# Patient Record
Sex: Male | Born: 1965 | Race: White | Hispanic: No | Marital: Married | Smoking: Current every day smoker
Health system: Southern US, Community
[De-identification: ages and names within clinical notes are randomized; demographics above are authoritative.]

## PROBLEM LIST (undated history)

## (undated) DIAGNOSIS — M810 Age-related osteoporosis without current pathological fracture: Secondary | ICD-10-CM

## (undated) DIAGNOSIS — E119 Type 2 diabetes mellitus without complications: Secondary | ICD-10-CM

## (undated) DIAGNOSIS — N289 Disorder of kidney and ureter, unspecified: Secondary | ICD-10-CM

## (undated) HISTORY — DX: Age-related osteoporosis without current pathological fracture: M81.0

## (undated) HISTORY — PX: BACK SURGERY: SHX140

## (undated) HISTORY — DX: Type 2 diabetes mellitus without complications: E11.9

## (undated) HISTORY — DX: Disorder of kidney and ureter, unspecified: N28.9

---

## 2016-12-04 LAB — HEMOGLOBIN A1C: Hemoglobin A1C: 8.6

## 2016-12-04 LAB — LIPID PANEL
Cholesterol: 159 (ref 0–200)
HDL: 45 (ref 35–70)
LDL CALC: 86
Triglycerides: 257 — AB (ref 40–160)

## 2016-12-04 LAB — BASIC METABOLIC PANEL
BUN: 47 — AB (ref 4–21)
CREATININE: 1.7 — AB (ref <=1.3)

## 2016-12-25 ENCOUNTER — Other Ambulatory Visit (HOSPITAL_COMMUNITY): Payer: Self-pay | Admitting: Internal Medicine

## 2016-12-25 DIAGNOSIS — N183 Chronic kidney disease, stage 3 unspecified: Secondary | ICD-10-CM

## 2016-12-31 ENCOUNTER — Ambulatory Visit (HOSPITAL_COMMUNITY)
Admission: RE | Admit: 2016-12-31 | Discharge: 2016-12-31 | Disposition: A | Discharge status: Home / Self Care | Payer: Medicaid Other | Source: Ambulatory Visit | Attending: Internal Medicine | Admitting: Internal Medicine

## 2016-12-31 DIAGNOSIS — N183 Chronic kidney disease, stage 3 unspecified: Secondary | ICD-10-CM

## 2017-01-22 ENCOUNTER — Ambulatory Visit: Payer: Self-pay | Admitting: "Endocrinology

## 2017-02-12 ENCOUNTER — Encounter: Payer: Self-pay | Admitting: "Endocrinology

## 2017-02-12 ENCOUNTER — Ambulatory Visit (INDEPENDENT_AMBULATORY_CARE_PROVIDER_SITE_OTHER): Payer: Medicaid Other | Admitting: "Endocrinology

## 2017-02-12 VITALS — BP 147/94 | HR 76 | Ht 75.0 in | Wt 278.0 lb

## 2017-02-12 DIAGNOSIS — E782 Mixed hyperlipidemia: Secondary | ICD-10-CM | POA: Diagnosis not present

## 2017-02-12 DIAGNOSIS — I10 Essential (primary) hypertension: Secondary | ICD-10-CM

## 2017-02-12 DIAGNOSIS — Z6834 Body mass index (BMI) 34.0-34.9, adult: Secondary | ICD-10-CM | POA: Diagnosis not present

## 2017-02-12 DIAGNOSIS — Z794 Long term (current) use of insulin: Secondary | ICD-10-CM

## 2017-02-12 DIAGNOSIS — E6609 Other obesity due to excess calories: Secondary | ICD-10-CM

## 2017-02-12 DIAGNOSIS — E1122 Type 2 diabetes mellitus with diabetic chronic kidney disease: Secondary | ICD-10-CM | POA: Diagnosis not present

## 2017-02-12 DIAGNOSIS — N183 Chronic kidney disease, stage 3 unspecified: Secondary | ICD-10-CM

## 2017-02-12 NOTE — Patient Instructions (Signed)

## 2017-02-12 NOTE — Progress Notes (Signed)
Subjective:    Patient ID: Richard Crane, male    DOB: 1966/01/29.  he is being seen in consultation for management of currently uncontrolled symptomatic diabetes requested by  Richard Gully, Richard Crane.   Past Medical History:  Diagnosis Date  . Diabetes mellitus, type II (HCC)   . Kidney disease   . Osteoporosis    Past Surgical History:  Procedure Laterality Date  . BACK SURGERY     Social History   Social History  . Marital status: Married    Spouse name: N/A  . Number of children: N/A  . Years of education: N/A   Social History Main Topics  . Smoking status: Current Every Day Smoker  . Smokeless tobacco: Never Used  . Alcohol use None  . Drug use: No  . Sexual activity: Not Asked   Other Topics Concern  . None   Social History Narrative  . None   Outpatient Encounter Prescriptions as of 02/12/2017  Medication Sig  . insulin glargine (LANTUS) 100 UNIT/ML injection Inject 60 Units into the skin at bedtime.  Marland Kitchen zolpidem (AMBIEN) 10 MG tablet Take 10 mg by mouth at bedtime as needed.  . insulin lispro (HUMALOG) 100 UNIT/ML injection Inject 10-16 Units into the skin 3 (three) times daily before meals.  Marland Kitchen lisinopril (PRINIVIL,ZESTRIL) 5 MG tablet Take 5 mg by mouth daily.  . simvastatin (ZOCOR) 5 MG tablet Take 5 mg by mouth daily.  . [DISCONTINUED] Insulin Glargine (LANTUS SOLOSTAR [place]) Inject into the skin every morning.   No facility-administered encounter medications on file as of 02/12/2017.     ALLERGIES: No Known Allergies  VACCINATION STATUS:  There is no immunization history on file for this patient.  Diabetes  He presents for his follow-up diabetic visit. He has type 2 diabetes mellitus. Onset time: He was diagnosed at approximate age of 51 years. His disease course has been worsening. There are no hypoglycemic associated symptoms. Pertinent negatives for hypoglycemia include no confusion, headaches, pallor or seizures. Associated symptoms include blurred  vision, polydipsia and polyuria. Pertinent negatives for diabetes include no chest pain, no fatigue, no polyphagia and no weakness. There are no hypoglycemic complications. Symptoms are worsening. Diabetic complications include nephropathy. Current diabetic treatment includes intensive insulin program. His weight is stable. He is following a generally unhealthy diet. When asked about meal planning, he reported none. He has not had a previous visit with a dietitian. He never participates in exercise. (He did not bring any meter nor logs to review today. His most recent A1c was 8.6%.) An ACE inhibitor/angiotensin II receptor blocker is being taken. He does not see a podiatrist. Hyperlipidemia  This is a chronic problem. The current episode started more than 1 year ago. The problem is uncontrolled (His current triglyceride level 257 from 12/04/2016.). Exacerbating diseases include diabetes and obesity. Pertinent negatives include no chest pain, myalgias or shortness of breath. Risk factors for coronary artery disease include diabetes mellitus, dyslipidemia, hypertension, a sedentary lifestyle, family history, male sex and obesity.  Hypertension  This is a chronic problem. The current episode started more than 1 year ago. Associated symptoms include blurred vision. Pertinent negatives include no chest pain, headaches, neck pain, palpitations or shortness of breath. Risk factors for coronary artery disease include diabetes mellitus, dyslipidemia, male gender, obesity, sedentary lifestyle and smoking/tobacco exposure. Past treatments include ACE inhibitors.       Review of Systems  Constitutional: Negative for chills, fatigue, fever and unexpected weight change.  HENT: Negative  for dental problem, mouth sores and trouble swallowing.   Eyes: Positive for blurred vision. Negative for visual disturbance.  Respiratory: Negative for cough, choking, chest tightness, shortness of breath and wheezing.    Cardiovascular: Negative for chest pain, palpitations and leg swelling.  Gastrointestinal: Negative for abdominal distention, abdominal pain, constipation, diarrhea, nausea and vomiting.  Endocrine: Positive for polydipsia and polyuria. Negative for polyphagia.  Genitourinary: Negative for dysuria, flank pain, hematuria and urgency.  Musculoskeletal: Negative for back pain, gait problem, myalgias and neck pain.  Skin: Negative for pallor, rash and wound.  Neurological: Negative for seizures, syncope, weakness, numbness and headaches.  Psychiatric/Behavioral: Negative.  Negative for confusion and dysphoric mood.    Objective:    BP (!) 147/94   Pulse 76   Ht 6\' 3"  (1.905 m)   Wt 278 lb (126.1 kg)   BMI 34.75 kg/m   Wt Readings from Last 3 Encounters:  02/12/17 278 lb (126.1 kg)     Physical Exam  Constitutional: He is oriented to person, place, and time. He appears well-developed and well-nourished. He is cooperative. No distress.  HENT:  Head: Normocephalic and atraumatic.  Eyes: EOM are normal.  Neck: Normal range of motion. Neck supple. No tracheal deviation present. No thyromegaly present.  Cardiovascular: Normal rate, S1 normal, S2 normal and normal heart sounds.  Exam reveals no gallop.   No murmur heard. Pulses:      Dorsalis pedis pulses are 1+ on the right side, and 1+ on the left side.       Posterior tibial pulses are 1+ on the right side, and 1+ on the left side.  Pulmonary/Chest: Breath sounds normal. No respiratory distress. He has no wheezes.  Abdominal: Soft. Bowel sounds are normal. He exhibits no distension. There is no tenderness. There is no guarding and no CVA tenderness.  Musculoskeletal: He exhibits no edema.       Right shoulder: He exhibits no swelling and no deformity.  Neurological: He is alert and oriented to person, place, and time. He has normal strength and normal reflexes. No cranial nerve deficit or sensory deficit. Gait normal.  Skin: Skin is  warm and dry. No rash noted. No cyanosis. Nails show no clubbing.  Psychiatric: He has a normal mood and affect. His speech is normal and behavior is normal. Judgment and thought content normal. Cognition and memory are normal.      CMP ( most recent) CMP     Component Value Date/Time   BUN 47 (A) 12/04/2016   CREATININE 1.7 (A) 12/04/2016     Diabetic Labs (most recent): Lab Results  Component Value Date   HGBA1C 8.6 12/04/2016     Lipid Panel ( most recent) Lipid Panel     Component Value Date/Time   CHOL 159 12/04/2016   TRIG 257 (A) 12/04/2016   HDL 45 12/04/2016   LDLCALC 86 12/04/2016     Assessment & Plan:   1. Type 2 diabetes mellitus with stage 3 chronic kidney disease, with long-term current use of insulin (HCC)  - Patient has currently uncontrolled symptomatic type 2 DM since  51 years of age,  with most recent A1c of 8.6 %. Recent labs reviewed.  -his diabetes is complicated by stage 3 renal insufficiency, obesity/sedentary life, chronic heavy smoking and Richard Crane remains at a high risk for more acute and chronic complications which include CAD, CVA, CKD, retinopathy, and neuropathy. These are all discussed in detail with the patient.  - I have  counseled him on diet management and weight loss, by adopting a carbohydrate restricted/protein rich diet.  - Suggestion is made for him to avoid simple carbohydrates  from his diet including Cakes, Sweet Desserts, Ice Cream, Soda (diet and regular), Sweet Tea, Candies, Chips, Cookies, Store Bought Juices, Alcohol in Excess of  1-2 drinks a day, Artificial Sweeteners, and "Sugar-free" Products. This will help patient to have stable blood glucose profile and potentially avoid unintended weight gain.  - I encouraged him to switch to  unprocessed or minimally processed complex starch and increased protein intake (animal or plant source), fruits, and vegetables.  - he is advised to stick to a routine mealtimes to eat 3  meals  a day and avoid unnecessary snacks ( to snack only to correct hypoglycemia).   - he will be scheduled with Richard Crane, Richard Crane, Richard Crane for individualized DM education.  - I have approached him with the following individualized plan to manage diabetes and patient agrees:   - I  will proceed to readjust his basal insulin Lantus to 60 units daily at bedtime , and prandial insulin Humalog 10 units 3 times a day with meals  for pre-meal BG readings of 90-150mg /dl, plus patient specific correction dose for unexpected hyperglycemia above 150mg /dl, associated with strict monitoring of glucose 4 times a day-before meals and at bedtime. - Patient is warned not to take insulin without proper monitoring per orders. -Adjustment parameters are given for hypo and hyperglycemia in writing. -Patient is encouraged to call clinic for blood glucose levels less than 70 or above 300 mg /dl. -Patient is not a candidate for metformin, SGLT2 inhibitors due to CKD.  - he will be considered for incretin therapy as appropriate next visit. - Patient specific target  A1c;  LDL, HDL, Triglycerides, and  Waist Circumference were discussed in detail.  2) BP/HTN:  uncontrolled with blood pressure during this clinic visit was 147/94.  patient is on a small dose of lisinopril 5 mg by mouth daily, declines offer to increase her dose.  3) Lipids/HPL:   Uncontrolled triglycerides 257.   Patient is advised to continue statins. 4)  Weight/Diet: Richard Crane Consult will be initiated , exercise, and detailed carbohydrates information provided.  5) Chronic Care/Health Maintenance:  -he  is on ACEI/ARB and Statin medications and  is encouraged to continue to follow up with Ophthalmology, Dentist,  Podiatrist at least yearly or according to recommendations, and advised to  quit smoking. I have recommended yearly flu vaccine and pneumonia vaccination at least every 5 years; moderate intensity exercise for up to 150 minutes weekly; and  sleep  for at least 7 hours a day.  - Time spent with the patient: 1 hour, of which >50% was spent in obtaining information about his symptoms, reviewing his previous labs, evaluations, and treatments, counseling him about his  currently uncontrolled complicated type 2 diabetes, hyperlipidemia, and hypertension, and developing a plan for long term treatment; he had a number of questions which I addressed.  - I advised patient to maintain close follow up with Richard GullyFanta, [place], Richard Crane for primary care needs.  Follow up plan: - Return in about 1 week (around 02/19/2017) for follow up with meter and logs- no labs.  Richard LunchGebre Nida, Richard Crane Phone: [phone]  Fax: [phone]   02/12/2017, 3:12 PM This note was partially dictated with voice recognition software. Similar sounding words can be transcribed inadequately or may not  be corrected upon review.

## 2017-03-03 ENCOUNTER — Ambulatory Visit: Payer: Medicaid Other | Admitting: "Endocrinology

## 2017-03-31 ENCOUNTER — Ambulatory Visit: Payer: Medicaid Other | Admitting: "Endocrinology

## 2017-08-18 ENCOUNTER — Ambulatory Visit: Payer: Medicaid Other | Admitting: "Endocrinology

## 2017-09-02 ENCOUNTER — Ambulatory Visit: Payer: Medicaid Other | Admitting: "Endocrinology

## 2017-09-03 ENCOUNTER — Encounter: Payer: Self-pay | Admitting: "Endocrinology

## 2018-06-28 DIAGNOSIS — E1142 Type 2 diabetes mellitus with diabetic polyneuropathy: Secondary | ICD-10-CM | POA: Diagnosis not present

## 2018-06-28 DIAGNOSIS — K589 Irritable bowel syndrome without diarrhea: Secondary | ICD-10-CM | POA: Diagnosis not present

## 2018-06-28 DIAGNOSIS — F17210 Nicotine dependence, cigarettes, uncomplicated: Secondary | ICD-10-CM | POA: Diagnosis not present

## 2018-06-28 DIAGNOSIS — J449 Chronic obstructive pulmonary disease, unspecified: Secondary | ICD-10-CM | POA: Diagnosis not present

## 2018-06-28 DIAGNOSIS — F17200 Nicotine dependence, unspecified, uncomplicated: Secondary | ICD-10-CM | POA: Diagnosis not present

## 2018-07-23 DIAGNOSIS — J449 Chronic obstructive pulmonary disease, unspecified: Secondary | ICD-10-CM | POA: Diagnosis not present

## 2018-07-23 DIAGNOSIS — E1142 Type 2 diabetes mellitus with diabetic polyneuropathy: Secondary | ICD-10-CM | POA: Diagnosis not present

## 2018-10-25 DIAGNOSIS — I10 Essential (primary) hypertension: Secondary | ICD-10-CM | POA: Diagnosis not present

## 2018-10-25 DIAGNOSIS — Z0001 Encounter for general adult medical examination with abnormal findings: Secondary | ICD-10-CM | POA: Diagnosis not present

## 2018-10-25 DIAGNOSIS — J449 Chronic obstructive pulmonary disease, unspecified: Secondary | ICD-10-CM | POA: Diagnosis not present

## 2018-10-25 DIAGNOSIS — E1142 Type 2 diabetes mellitus with diabetic polyneuropathy: Secondary | ICD-10-CM | POA: Diagnosis not present

## 2018-12-16 DIAGNOSIS — L600 Ingrowing nail: Secondary | ICD-10-CM | POA: Diagnosis not present

## 2018-12-16 DIAGNOSIS — E1142 Type 2 diabetes mellitus with diabetic polyneuropathy: Secondary | ICD-10-CM | POA: Diagnosis not present

## 2019-02-15 DIAGNOSIS — M549 Dorsalgia, unspecified: Secondary | ICD-10-CM | POA: Diagnosis not present

## 2019-02-15 DIAGNOSIS — Z23 Encounter for immunization: Secondary | ICD-10-CM | POA: Diagnosis not present

## 2019-02-15 DIAGNOSIS — F17210 Nicotine dependence, cigarettes, uncomplicated: Secondary | ICD-10-CM | POA: Diagnosis not present

## 2019-02-15 DIAGNOSIS — E1142 Type 2 diabetes mellitus with diabetic polyneuropathy: Secondary | ICD-10-CM | POA: Diagnosis not present

## 2019-02-15 DIAGNOSIS — J449 Chronic obstructive pulmonary disease, unspecified: Secondary | ICD-10-CM | POA: Diagnosis not present

## 2019-03-10 IMAGING — US US RENAL
1 series · 14 of 25 positions shown · non-contrast
Comparison: None.

CLINICAL DATA: Stage III chronic renal disease

EXAM:
RENAL / URINARY TRACT ULTRASOUND COMPLETE

[Series 1: us renal · 0.30mm/px · 14 of 48 slices shown]
[im 1/48]
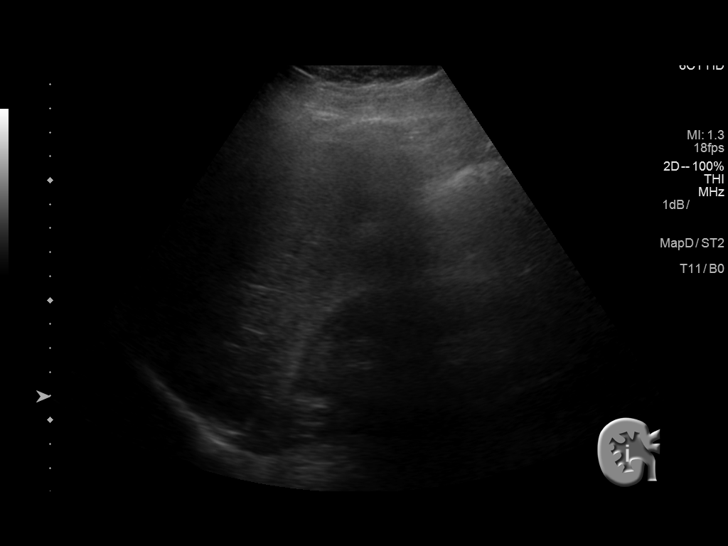
[im 4/48]
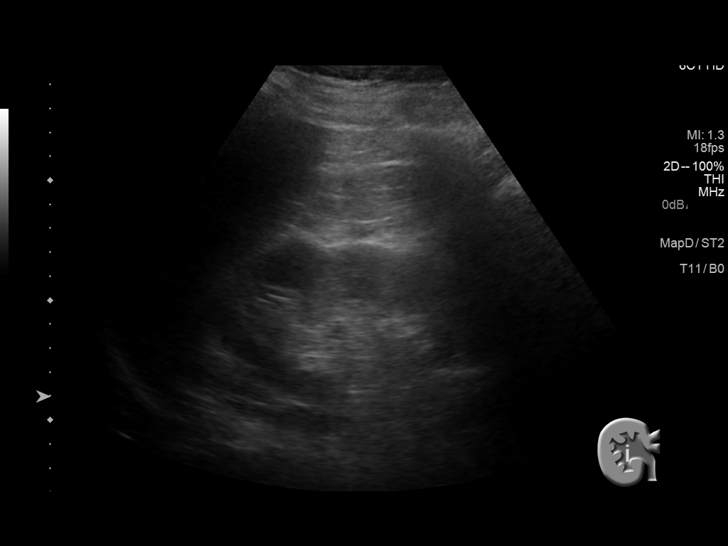
[im 8/48]
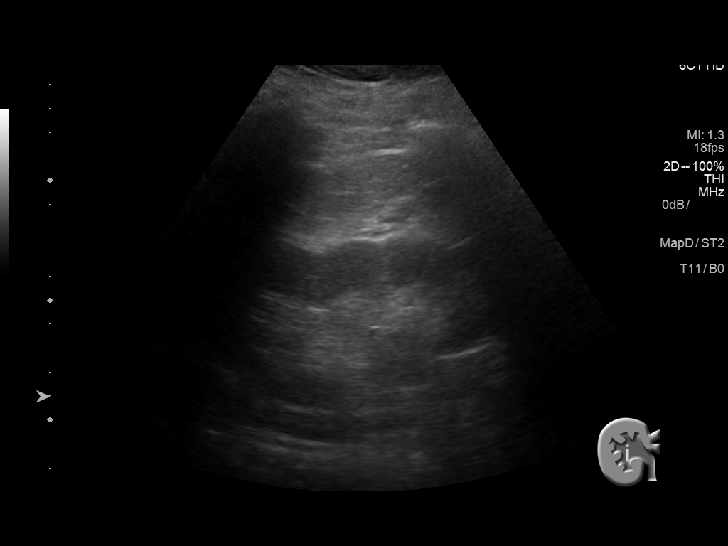
[im 12/48]
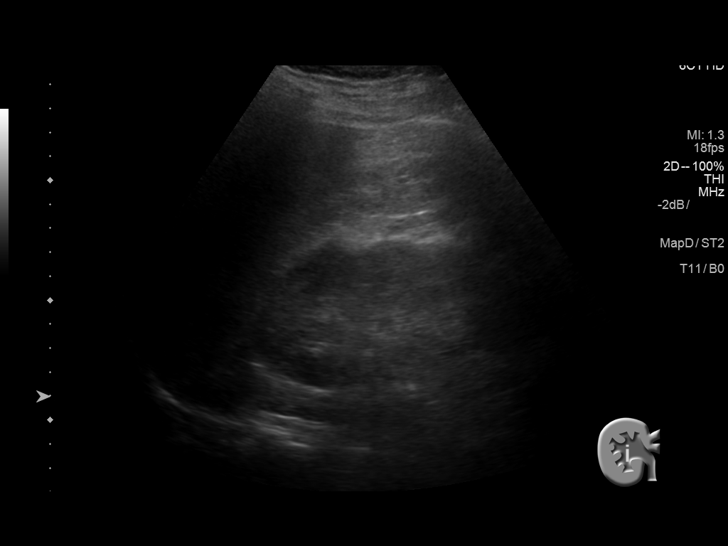
[im 16/48]
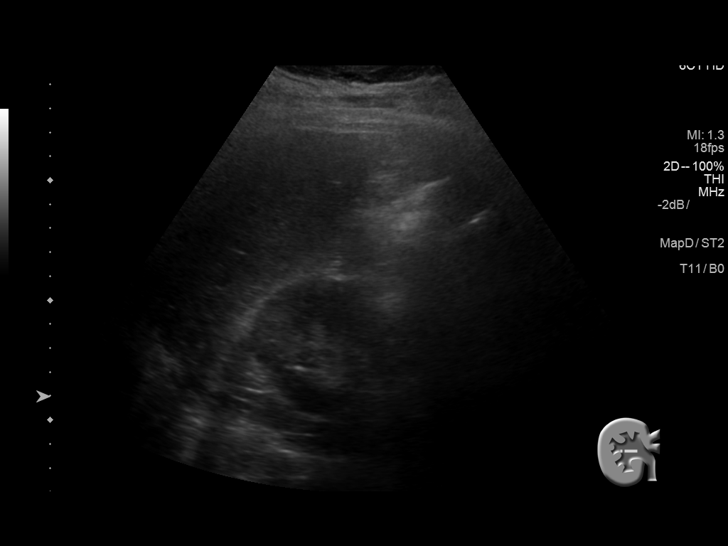
[im 18/48]
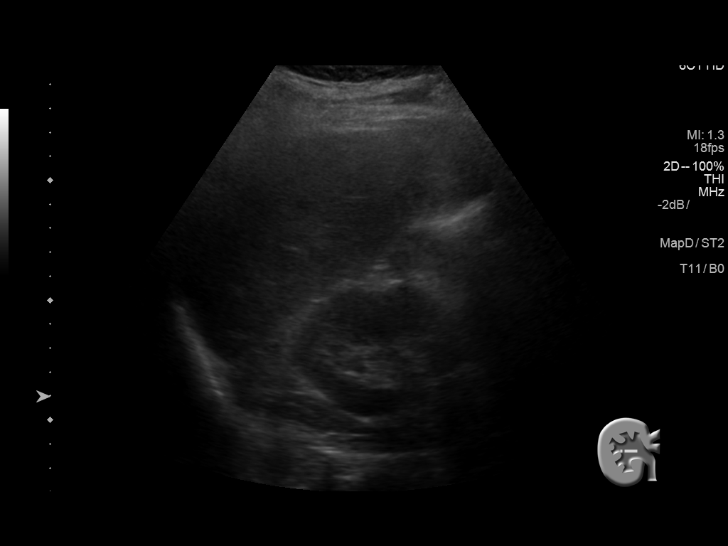
[im 22/48]
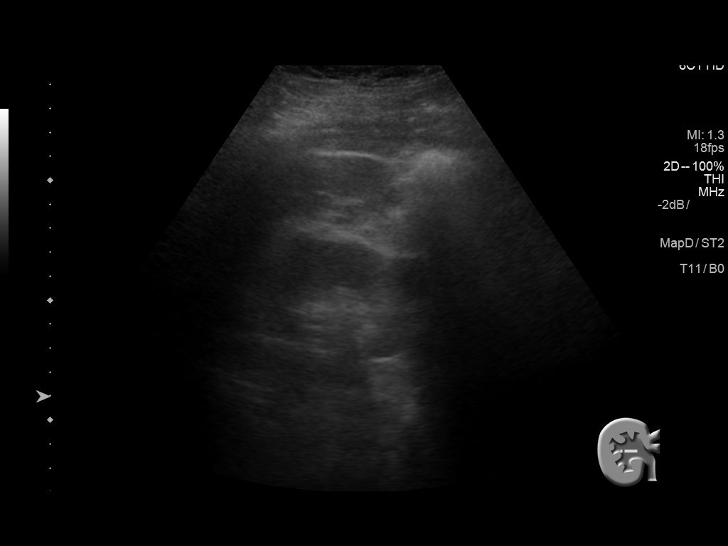
[im 26/48]
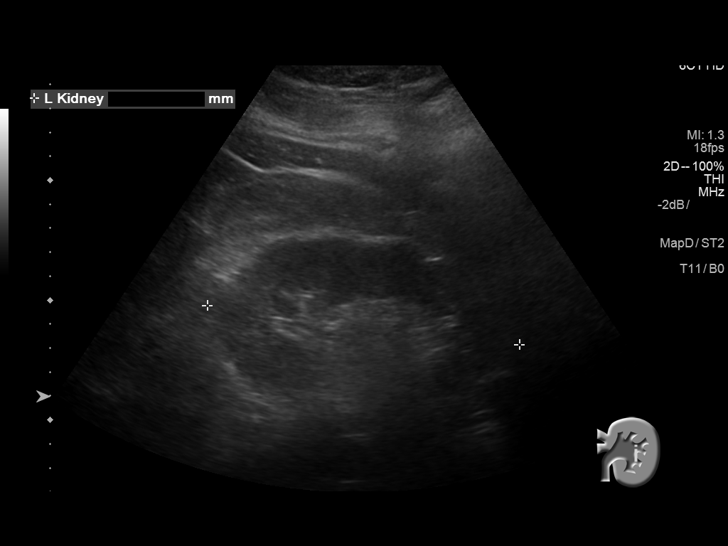
[im 30/48]
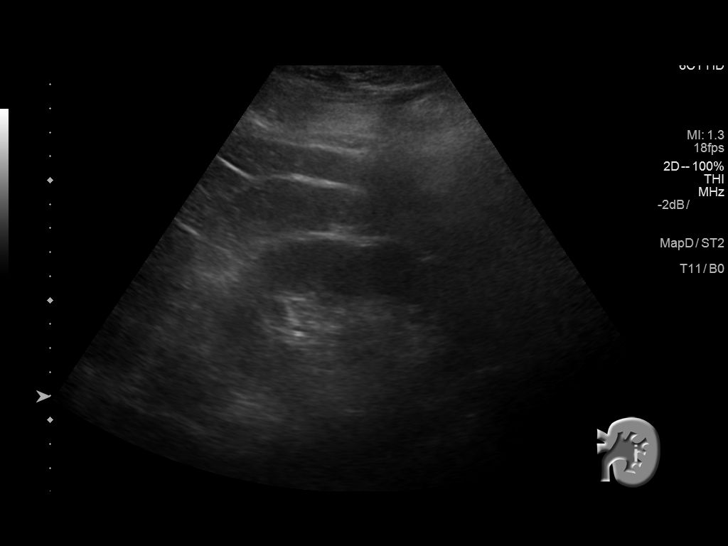
[im 32/48]
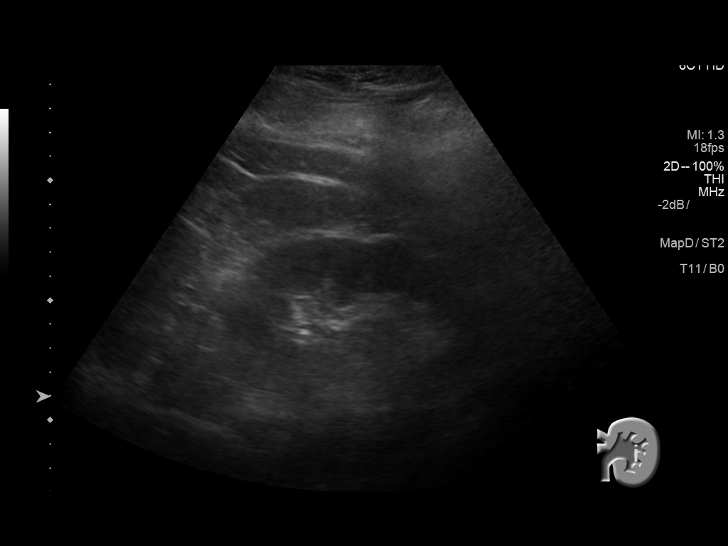
[im 36/48]
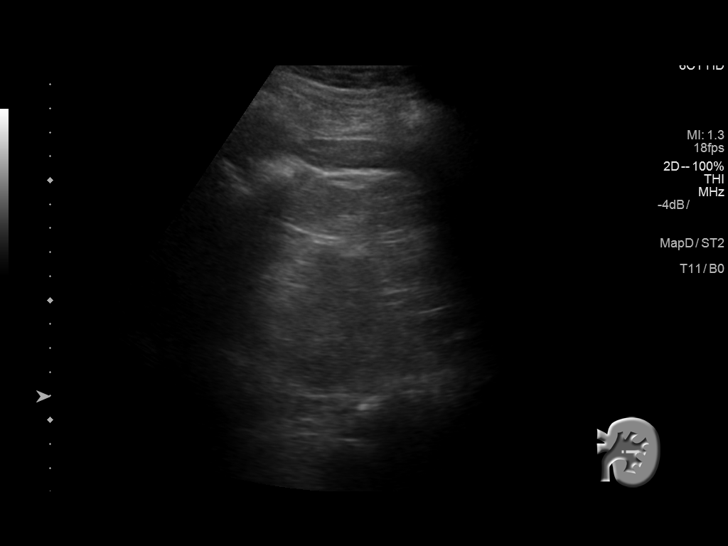
[im 40/48]
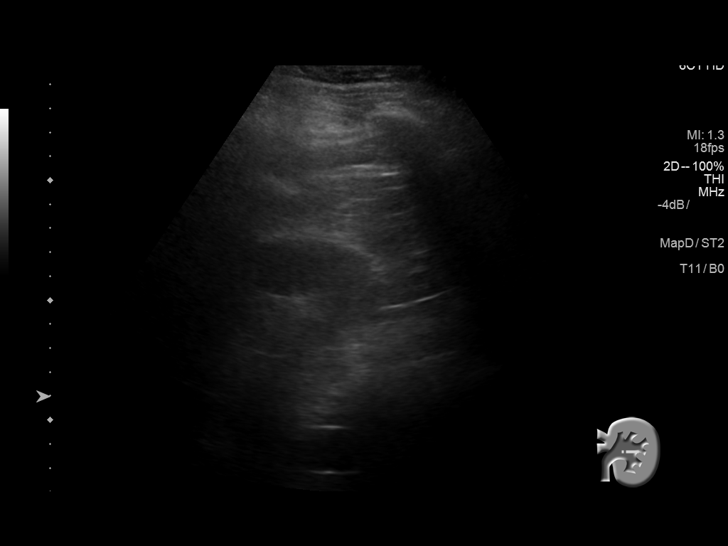
[im 44/48]
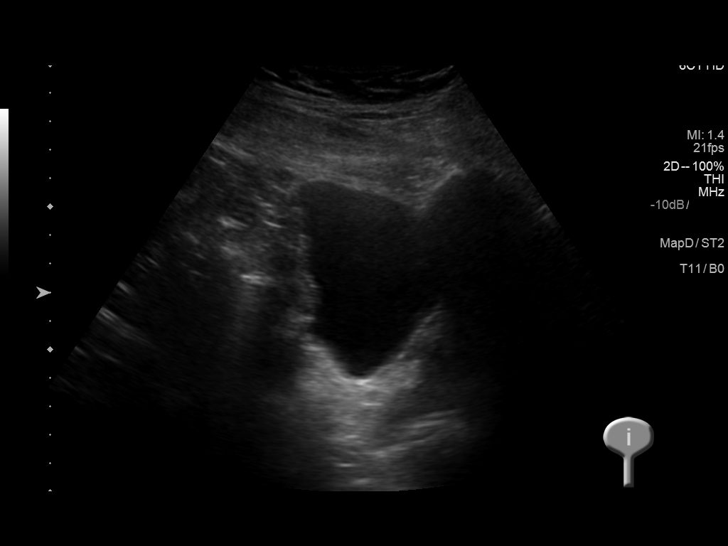
[im 48/48]
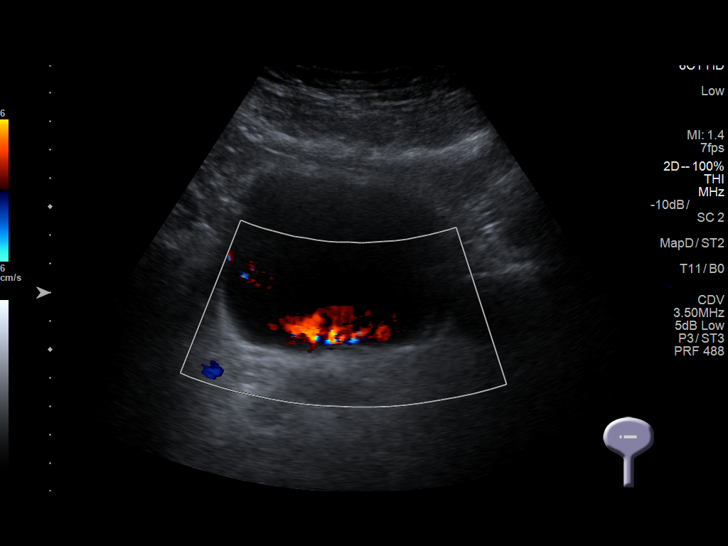

[14 of 25 positions shown; findings below may reference images not displayed]

FINDINGS: Right Kidney:

Length: 12.9 cm. Echogenicity and renal cortical thickness are
within normal limits. No mass, perinephric fluid, or hydronephrosis
visualized. No sonographically demonstrable calculus or
ureterectasis.

Left Kidney:

Length: 12.8 cm. Echogenicity and renal cortical thickness are
within normal limits. No mass, perinephric fluid, or hydronephrosis
visualized. No sonographically demonstrable calculus or
ureterectasis.

Bladder:

Appears normal for degree of bladder distention.
IMPRESSION: Study within normal limits.

## 2022-01-29 ENCOUNTER — Other Ambulatory Visit (HOSPITAL_COMMUNITY): Payer: Self-pay | Admitting: Gerontology

## 2022-01-29 DIAGNOSIS — E1142 Type 2 diabetes mellitus with diabetic polyneuropathy: Secondary | ICD-10-CM

## 2022-02-05 ENCOUNTER — Ambulatory Visit (HOSPITAL_COMMUNITY): Payer: Medicaid Other

## 2022-02-12 ENCOUNTER — Ambulatory Visit (HOSPITAL_COMMUNITY)
Admission: RE | Admit: 2022-02-12 | Discharge: 2022-02-12 | Disposition: A | Discharge status: Home / Self Care | Payer: Medicaid Other | Source: Ambulatory Visit | Attending: Gerontology | Admitting: Gerontology

## 2022-02-12 ENCOUNTER — Other Ambulatory Visit (HOSPITAL_COMMUNITY): Payer: Self-pay | Admitting: Gerontology

## 2022-02-12 ENCOUNTER — Other Ambulatory Visit (HOSPITAL_COMMUNITY)
Admission: RE | Admit: 2022-02-12 | Discharge: 2022-02-12 | Disposition: A | Discharge status: Home / Self Care | Payer: Medicaid Other | Source: Ambulatory Visit | Attending: Internal Medicine | Admitting: Internal Medicine

## 2022-02-12 DIAGNOSIS — M79641 Pain in right hand: Secondary | ICD-10-CM | POA: Insufficient documentation

## 2022-02-12 DIAGNOSIS — E1142 Type 2 diabetes mellitus with diabetic polyneuropathy: Secondary | ICD-10-CM | POA: Insufficient documentation

## 2022-02-12 DIAGNOSIS — Z0001 Encounter for general adult medical examination with abnormal findings: Secondary | ICD-10-CM | POA: Diagnosis present

## 2022-02-12 LAB — CBC WITH DIFFERENTIAL/PLATELET
Abs Immature Granulocytes: 0.03 10*3/uL (ref 0.00–0.07)
Basophils Absolute: 0 10*3/uL (ref 0.0–0.1)
Basophils Relative: 1 %
Eosinophils Absolute: 0.2 10*3/uL (ref 0.0–0.5)
Eosinophils Relative: 2 %
HCT: 46.2 % (ref 39.0–52.0)
Hemoglobin: 15.2 g/dL (ref 13.0–17.0)
Immature Granulocytes: 1 %
Lymphocytes Relative: 30 %
Lymphs Abs: 2 10*3/uL (ref 0.7–4.0)
MCH: 32.3 pg (ref 26.0–34.0)
MCHC: 32.9 g/dL (ref 30.0–36.0)
MCV: 98.1 fL (ref 80.0–100.0)
Monocytes Absolute: 0.7 10*3/uL (ref 0.1–1.0)
Monocytes Relative: 11 %
Neutro Abs: 3.6 10*3/uL (ref 1.7–7.7)
Neutrophils Relative %: 55 %
Platelets: 302 10*3/uL (ref 150–400)
RBC: 4.71 MIL/uL (ref 4.22–5.81)
RDW: 12.6 % (ref 11.5–15.5)
WBC: 6.5 10*3/uL (ref 4.0–10.5)
nRBC: 0 % (ref 0.0–0.2)

## 2022-02-12 LAB — HEPATIC FUNCTION PANEL
ALT: 28 U/L (ref 0–44)
AST: 26 U/L (ref 15–41)
Albumin: 4.1 g/dL (ref 3.5–5.0)
Alkaline Phosphatase: 76 U/L (ref 38–126)
Bilirubin, Direct: <0.1 mg/dL (ref 0.0–0.2)
Total Bilirubin: 0.5 mg/dL (ref 0.3–1.2)
Total Protein: 7.6 g/dL (ref 6.5–8.1)

## 2022-02-12 LAB — BASIC METABOLIC PANEL
Anion gap: 8 (ref 5–15)
BUN: 33 mg/dL — ABNORMAL HIGH (ref 6–20)
CO2: 21 mmol/L — ABNORMAL LOW (ref 22–32)
Calcium: 10 mg/dL (ref 8.9–10.3)
Chloride: 107 mmol/L (ref 98–111)
Creatinine, Ser: 1.12 mg/dL (ref 0.61–1.24)
GFR, Estimated: >60 mL/min (ref >60)
Glucose, Bld: 200 mg/dL — ABNORMAL HIGH (ref 70–99)
Potassium: 4.7 mmol/L (ref 3.5–5.1)
Sodium: 136 mmol/L (ref 135–145)

## 2022-02-12 LAB — LIPID PANEL
Cholesterol: 186 mg/dL (ref 0–200)
HDL: 39 mg/dL — ABNORMAL LOW (ref >40)
LDL Cholesterol: 96 mg/dL (ref 0–99)
Total CHOL/HDL Ratio: 4.8 RATIO
Triglycerides: 255 mg/dL — ABNORMAL HIGH (ref <150)
VLDL: 51 mg/dL — ABNORMAL HIGH (ref 0–40)

## 2022-02-12 LAB — PSA: Prostatic Specific Antigen: 1.27 ng/mL (ref 0.00–4.00)

## 2022-02-13 LAB — HEMOGLOBIN A1C
Hgb A1c MFr Bld: 8 % — ABNORMAL HIGH (ref 4.8–5.6)
Mean Plasma Glucose: 182.9 mg/dL

## 2022-02-13 LAB — MICROALBUMIN / CREATININE URINE RATIO
Creatinine, Urine: 113.9 mg/dL
Microalb Creat Ratio: 9 mg/g creat (ref 0–29)
Microalb, Ur: 9.8 ug/mL — ABNORMAL HIGH

## 2022-09-03 ENCOUNTER — Ambulatory Visit (HOSPITAL_COMMUNITY)
Admission: RE | Admit: 2022-09-03 | Discharge: 2022-09-03 | Disposition: A | Discharge status: Home / Self Care | Payer: Medicaid Other | Source: Ambulatory Visit | Attending: Internal Medicine | Admitting: Internal Medicine

## 2022-09-03 ENCOUNTER — Other Ambulatory Visit (HOSPITAL_COMMUNITY)
Admission: RE | Admit: 2022-09-03 | Discharge: 2022-09-03 | Disposition: A | Discharge status: Home / Self Care | Payer: Medicaid Other | Source: Ambulatory Visit | Attending: Internal Medicine | Admitting: Internal Medicine

## 2022-09-03 ENCOUNTER — Other Ambulatory Visit (HOSPITAL_COMMUNITY): Payer: Self-pay | Admitting: Internal Medicine

## 2022-09-03 DIAGNOSIS — I10 Essential (primary) hypertension: Secondary | ICD-10-CM | POA: Insufficient documentation

## 2022-09-03 DIAGNOSIS — M25572 Pain in left ankle and joints of left foot: Secondary | ICD-10-CM | POA: Insufficient documentation

## 2022-09-03 DIAGNOSIS — Z0001 Encounter for general adult medical examination with abnormal findings: Secondary | ICD-10-CM | POA: Insufficient documentation

## 2022-09-03 DIAGNOSIS — E1142 Type 2 diabetes mellitus with diabetic polyneuropathy: Secondary | ICD-10-CM | POA: Insufficient documentation

## 2022-09-03 LAB — CBC WITH DIFFERENTIAL/PLATELET
Abs Immature Granulocytes: 0.02 10*3/uL (ref 0.00–0.07)
Basophils Absolute: 0 10*3/uL (ref 0.0–0.1)
Basophils Relative: 1 %
Eosinophils Absolute: 0.2 10*3/uL (ref 0.0–0.5)
Eosinophils Relative: 2 %
HCT: 43.8 % (ref 39.0–52.0)
Hemoglobin: 14.9 g/dL (ref 13.0–17.0)
Immature Granulocytes: 0 %
Lymphocytes Relative: 38 %
Lymphs Abs: 2.6 10*3/uL (ref 0.7–4.0)
MCH: 32.5 pg (ref 26.0–34.0)
MCHC: 34 g/dL (ref 30.0–36.0)
MCV: 95.6 fL (ref 80.0–100.0)
Monocytes Absolute: 0.7 10*3/uL (ref 0.1–1.0)
Monocytes Relative: 10 %
Neutro Abs: 3.4 10*3/uL (ref 1.7–7.7)
Neutrophils Relative %: 49 %
Platelets: 305 10*3/uL (ref 150–400)
RBC: 4.58 MIL/uL (ref 4.22–5.81)
RDW: 13.3 % (ref 11.5–15.5)
WBC: 6.9 10*3/uL (ref 4.0–10.5)
nRBC: 0 % (ref 0.0–0.2)

## 2022-09-03 LAB — BASIC METABOLIC PANEL
Anion gap: 9 (ref 5–15)
BUN: 21 mg/dL — ABNORMAL HIGH (ref 6–20)
CO2: 23 mmol/L (ref 22–32)
Calcium: 9.4 mg/dL (ref 8.9–10.3)
Chloride: 104 mmol/L (ref 98–111)
Creatinine, Ser: 1.17 mg/dL (ref 0.61–1.24)
GFR, Estimated: >60 mL/min (ref >60)
Glucose, Bld: 280 mg/dL — ABNORMAL HIGH (ref 70–99)
Potassium: 4.4 mmol/L (ref 3.5–5.1)
Sodium: 136 mmol/L (ref 135–145)

## 2022-09-03 LAB — LIPID PANEL
Cholesterol: 194 mg/dL (ref 0–200)
HDL: 43 mg/dL (ref >40)
LDL Cholesterol: 76 mg/dL (ref 0–99)
Total CHOL/HDL Ratio: 4.5 RATIO
Triglycerides: 376 mg/dL — ABNORMAL HIGH (ref <150)
VLDL: 75 mg/dL — ABNORMAL HIGH (ref 0–40)

## 2022-09-03 LAB — HEPATIC FUNCTION PANEL
ALT: 35 U/L (ref 0–44)
AST: 35 U/L (ref 15–41)
Albumin: 3.7 g/dL (ref 3.5–5.0)
Alkaline Phosphatase: 91 U/L (ref 38–126)
Bilirubin, Direct: <0.1 mg/dL (ref 0.0–0.2)
Total Bilirubin: 0.7 mg/dL (ref 0.3–1.2)
Total Protein: 7.3 g/dL (ref 6.5–8.1)

## 2022-09-03 LAB — HEMOGLOBIN A1C
Hgb A1c MFr Bld: 8.4 % — ABNORMAL HIGH (ref 4.8–5.6)
Mean Plasma Glucose: 194.38 mg/dL

## 2022-09-04 LAB — MICROALBUMIN / CREATININE URINE RATIO
Creatinine, Urine: 97.3 mg/dL
Microalb Creat Ratio: 20 mg/g creat (ref 0–29)
Microalb, Ur: 19 ug/mL — ABNORMAL HIGH

## 2023-06-08 ENCOUNTER — Other Ambulatory Visit (HOSPITAL_COMMUNITY)
Admission: RE | Admit: 2023-06-08 | Discharge: 2023-06-08 | Disposition: A | Discharge status: Home / Self Care | Payer: Medicaid Other | Source: Ambulatory Visit | Attending: Internal Medicine | Admitting: Internal Medicine

## 2023-06-08 DIAGNOSIS — E1142 Type 2 diabetes mellitus with diabetic polyneuropathy: Secondary | ICD-10-CM | POA: Insufficient documentation

## 2023-06-08 DIAGNOSIS — Z0001 Encounter for general adult medical examination with abnormal findings: Secondary | ICD-10-CM | POA: Insufficient documentation

## 2023-06-08 DIAGNOSIS — I10 Essential (primary) hypertension: Secondary | ICD-10-CM | POA: Diagnosis not present

## 2023-06-08 LAB — LIPID PANEL
Cholesterol: 156 mg/dL (ref 0–200)
HDL: 41 mg/dL (ref >40)
LDL Cholesterol: 88 mg/dL (ref 0–99)
Total CHOL/HDL Ratio: 3.8 {ratio}
Triglycerides: 137 mg/dL (ref <150)
VLDL: 27 mg/dL (ref 0–40)

## 2023-06-08 LAB — HEMOGLOBIN A1C
Hgb A1c MFr Bld: 7.9 % — ABNORMAL HIGH (ref 4.8–5.6)
Mean Plasma Glucose: 180.03 mg/dL

## 2023-06-09 LAB — PSA: Prostatic Specific Antigen: 3.54 ng/mL (ref 0.00–4.00)

## 2024-01-01 ENCOUNTER — Other Ambulatory Visit (HOSPITAL_COMMUNITY)
Admission: RE | Admit: 2024-01-01 | Discharge: 2024-01-01 | Disposition: A | Discharge status: Home / Self Care | Source: Ambulatory Visit | Attending: Internal Medicine | Admitting: Internal Medicine

## 2024-01-01 DIAGNOSIS — E1165 Type 2 diabetes mellitus with hyperglycemia: Secondary | ICD-10-CM | POA: Diagnosis present

## 2024-01-01 DIAGNOSIS — I10 Essential (primary) hypertension: Secondary | ICD-10-CM | POA: Insufficient documentation

## 2024-01-01 LAB — BASIC METABOLIC PANEL WITH GFR
Anion gap: 11 (ref 5–15)
BUN: 17 mg/dL (ref 6–20)
CO2: 22 mmol/L (ref 22–32)
Calcium: 9 mg/dL (ref 8.9–10.3)
Chloride: 105 mmol/L (ref 98–111)
Creatinine, Ser: 1.16 mg/dL (ref 0.61–1.24)
GFR, Estimated: >60 mL/min (ref >60)
Glucose, Bld: 120 mg/dL — ABNORMAL HIGH (ref 70–99)
Potassium: 4.2 mmol/L (ref 3.5–5.1)
Sodium: 138 mmol/L (ref 135–145)

## 2024-01-02 LAB — HEMOGLOBIN A1C
Hgb A1c MFr Bld: 8.2 % — ABNORMAL HIGH (ref 4.8–5.6)
Mean Plasma Glucose: 189 mg/dL
